# Patient Record
Sex: Male | Born: 1965 | Race: White | Hispanic: No | Marital: Married | Smoking: Never smoker
Health system: Southern US, Community
[De-identification: ages and names within clinical notes are randomized; demographics above are authoritative.]

## PROBLEM LIST (undated history)

## (undated) DIAGNOSIS — H409 Unspecified glaucoma: Secondary | ICD-10-CM

## (undated) HISTORY — PX: OTHER SURGICAL HISTORY: SHX169

## (undated) HISTORY — PX: MOUTH SURGERY: SHX715

---

## 2001-02-13 ENCOUNTER — Emergency Department (HOSPITAL_COMMUNITY): Admission: EM | Admit: 2001-02-13 | Discharge: 2001-02-14 | Payer: Self-pay | Admitting: Emergency Medicine

## 2001-02-14 ENCOUNTER — Encounter: Payer: Self-pay | Admitting: Emergency Medicine

## 2009-11-19 ENCOUNTER — Emergency Department (HOSPITAL_BASED_OUTPATIENT_CLINIC_OR_DEPARTMENT_OTHER): Admission: EM | Admit: 2009-11-19 | Discharge: 2009-11-19 | Payer: Self-pay | Admitting: Emergency Medicine

## 2009-11-19 ENCOUNTER — Ambulatory Visit: Payer: Self-pay | Admitting: Diagnostic Radiology

## 2010-06-13 LAB — POCT CARDIAC MARKERS
CKMB, poc: <1 ng/mL — ABNORMAL LOW (ref 1.0–8.0)
CKMB, poc: <1 ng/mL — ABNORMAL LOW (ref 1.0–8.0)
Myoglobin, poc: 29.9 ng/mL (ref 12–200)
Myoglobin, poc: 45.4 ng/mL (ref 12–200)
Troponin i, poc: <0.05 ng/mL (ref 0.00–0.09)
Troponin i, poc: <0.05 ng/mL (ref 0.00–0.09)

## 2010-06-13 LAB — BASIC METABOLIC PANEL
BUN: 7 mg/dL (ref 6–23)
CO2: 27 mEq/L (ref 19–32)
Calcium: 9.7 mg/dL (ref 8.4–10.5)
Chloride: 104 mEq/L (ref 96–112)
Creatinine, Ser: 0.9 mg/dL (ref 0.4–1.5)
GFR calc Af Amer: >60 mL/min (ref >60)
GFR calc non Af Amer: >60 mL/min (ref >60)
Glucose, Bld: 98 mg/dL (ref 70–99)
Potassium: 4.1 mEq/L (ref 3.5–5.1)
Sodium: 141 mEq/L (ref 135–145)

## 2010-06-13 LAB — DIFFERENTIAL
Basophils Absolute: 0.1 10*3/uL (ref 0.0–0.1)
Basophils Relative: 2 % — ABNORMAL HIGH (ref 0–1)
Eosinophils Absolute: 0.3 10*3/uL (ref 0.0–0.7)
Eosinophils Relative: 5 % (ref 0–5)
Lymphocytes Relative: 25 % (ref 12–46)
Lymphs Abs: 1.7 10*3/uL (ref 0.7–4.0)
Monocytes Absolute: 0.6 10*3/uL (ref 0.1–1.0)
Monocytes Relative: 9 % (ref 3–12)
Neutro Abs: 4.1 10*3/uL (ref 1.7–7.7)
Neutrophils Relative %: 60 % (ref 43–77)

## 2010-06-13 LAB — CBC
HCT: 43.3 % (ref 39.0–52.0)
Hemoglobin: 15.3 g/dL (ref 13.0–17.0)
MCH: 31.6 pg (ref 26.0–34.0)
MCHC: 35.4 g/dL (ref 30.0–36.0)
MCV: 89.3 fL (ref 78.0–100.0)
Platelets: 225 10*3/uL (ref 150–400)
RBC: 4.84 MIL/uL (ref 4.22–5.81)
RDW: 12.6 % (ref 11.5–15.5)
WBC: 6.8 10*3/uL (ref 4.0–10.5)

## 2010-09-23 DIAGNOSIS — H409 Unspecified glaucoma: Secondary | ICD-10-CM | POA: Insufficient documentation

## 2010-09-23 DIAGNOSIS — R0989 Other specified symptoms and signs involving the circulatory and respiratory systems: Secondary | ICD-10-CM

## 2010-09-23 DIAGNOSIS — Z72820 Sleep deprivation: Secondary | ICD-10-CM | POA: Insufficient documentation

## 2014-03-27 ENCOUNTER — Other Ambulatory Visit: Payer: Self-pay | Admitting: Family Medicine

## 2014-03-27 DIAGNOSIS — M545 Low back pain: Secondary | ICD-10-CM

## 2014-04-06 ENCOUNTER — Ambulatory Visit
Admission: RE | Admit: 2014-04-06 | Discharge: 2014-04-06 | Disposition: A | Discharge status: Home / Self Care | Payer: BLUE CROSS/BLUE SHIELD | Source: Ambulatory Visit | Attending: Family Medicine | Admitting: Family Medicine

## 2014-04-06 DIAGNOSIS — M545 Low back pain: Secondary | ICD-10-CM

## 2015-07-10 DIAGNOSIS — R1031 Right lower quadrant pain: Secondary | ICD-10-CM | POA: Diagnosis not present

## 2015-08-29 DIAGNOSIS — E7800 Pure hypercholesterolemia, unspecified: Secondary | ICD-10-CM | POA: Diagnosis not present

## 2015-11-26 DIAGNOSIS — E7800 Pure hypercholesterolemia, unspecified: Secondary | ICD-10-CM | POA: Diagnosis not present

## 2015-11-26 DIAGNOSIS — E669 Obesity, unspecified: Secondary | ICD-10-CM | POA: Diagnosis not present

## 2015-11-26 DIAGNOSIS — R7301 Impaired fasting glucose: Secondary | ICD-10-CM | POA: Diagnosis not present

## 2015-12-25 DIAGNOSIS — Z23 Encounter for immunization: Secondary | ICD-10-CM | POA: Diagnosis not present

## 2016-01-27 DIAGNOSIS — H401131 Primary open-angle glaucoma, bilateral, mild stage: Secondary | ICD-10-CM | POA: Diagnosis not present

## 2016-03-25 DIAGNOSIS — Z1211 Encounter for screening for malignant neoplasm of colon: Secondary | ICD-10-CM | POA: Diagnosis not present

## 2016-03-25 DIAGNOSIS — D126 Benign neoplasm of colon, unspecified: Secondary | ICD-10-CM | POA: Diagnosis not present

## 2016-03-25 DIAGNOSIS — K621 Rectal polyp: Secondary | ICD-10-CM | POA: Diagnosis not present

## 2016-03-31 DIAGNOSIS — Z1211 Encounter for screening for malignant neoplasm of colon: Secondary | ICD-10-CM | POA: Diagnosis not present

## 2016-03-31 DIAGNOSIS — D126 Benign neoplasm of colon, unspecified: Secondary | ICD-10-CM | POA: Diagnosis not present

## 2016-05-05 DIAGNOSIS — H401131 Primary open-angle glaucoma, bilateral, mild stage: Secondary | ICD-10-CM | POA: Diagnosis not present

## 2016-06-02 DIAGNOSIS — H401131 Primary open-angle glaucoma, bilateral, mild stage: Secondary | ICD-10-CM | POA: Diagnosis not present

## 2016-06-04 DIAGNOSIS — Z0000 Encounter for general adult medical examination without abnormal findings: Secondary | ICD-10-CM | POA: Diagnosis not present

## 2016-06-04 DIAGNOSIS — R740 Nonspecific elevation of levels of transaminase and lactic acid dehydrogenase [LDH]: Secondary | ICD-10-CM | POA: Diagnosis not present

## 2016-06-04 DIAGNOSIS — Z131 Encounter for screening for diabetes mellitus: Secondary | ICD-10-CM | POA: Diagnosis not present

## 2016-06-04 DIAGNOSIS — Z13220 Encounter for screening for lipoid disorders: Secondary | ICD-10-CM | POA: Diagnosis not present

## 2016-06-04 DIAGNOSIS — R7989 Other specified abnormal findings of blood chemistry: Secondary | ICD-10-CM | POA: Diagnosis not present

## 2016-06-04 DIAGNOSIS — Z125 Encounter for screening for malignant neoplasm of prostate: Secondary | ICD-10-CM | POA: Diagnosis not present

## 2016-06-24 DIAGNOSIS — R740 Nonspecific elevation of levels of transaminase and lactic acid dehydrogenase [LDH]: Secondary | ICD-10-CM | POA: Diagnosis not present

## 2016-06-30 IMAGING — MR MR LUMBAR SPINE W/O CM
5 series · 45 of 48 positions shown · non-contrast
Comparison: None.

CLINICAL DATA: Acute onset low back pain 5 weeks ago. Pain is
located centrally and radiates into the left leg to the foot.
Decreasing left leg numbness.

EXAM:
MRI LUMBAR SPINE WITHOUT CONTRAST
TECHNIQUE: Multiplanar, multisequence MR imaging of the lumbar spine was
performed. No intravenous contrast was administered.

[Series 3: T2 · sagittal · 4.0mm · 0.88mm/px · 6 of 14 slices shown (1 of 2)]
[im 1/14]
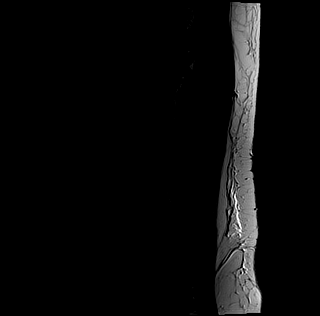
[im 3/14]
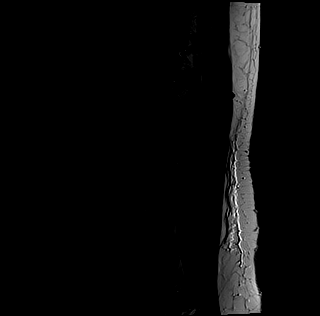
[im 6/14]
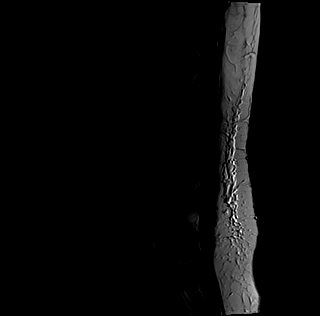
[im 8/14]
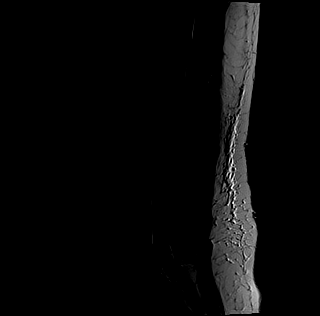
[im 11/14]
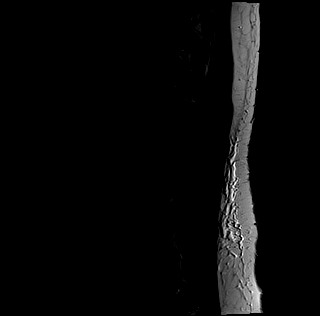
[im 14/14]
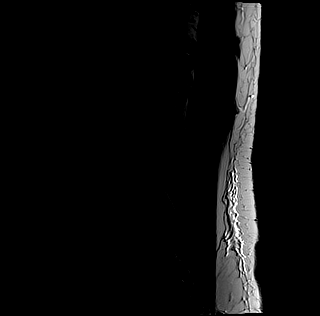

[Series 4: tirm sag · sagittal · 4.0mm · 0.55mm/px · 6 of 14 slices shown]
[im 1/14]
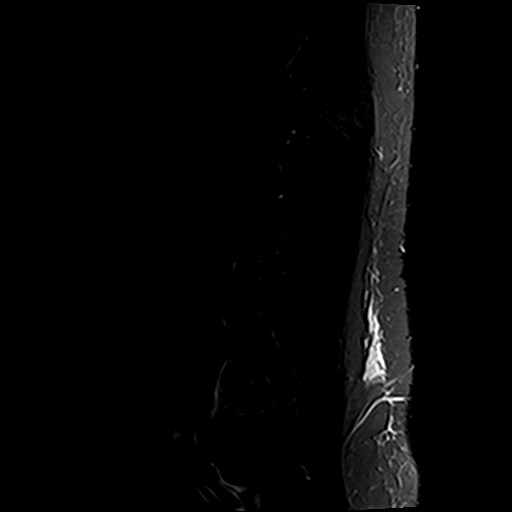
[im 3/14]
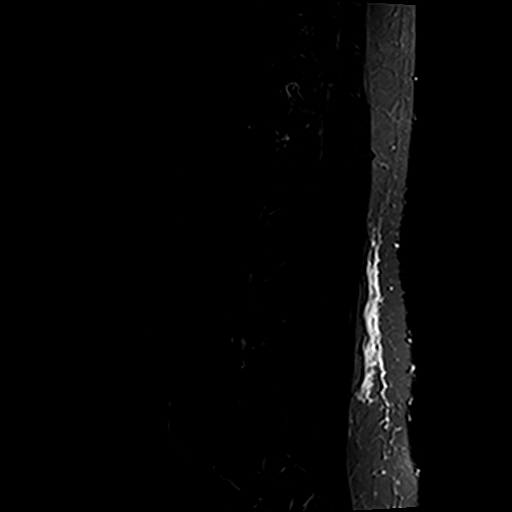
[im 6/14]
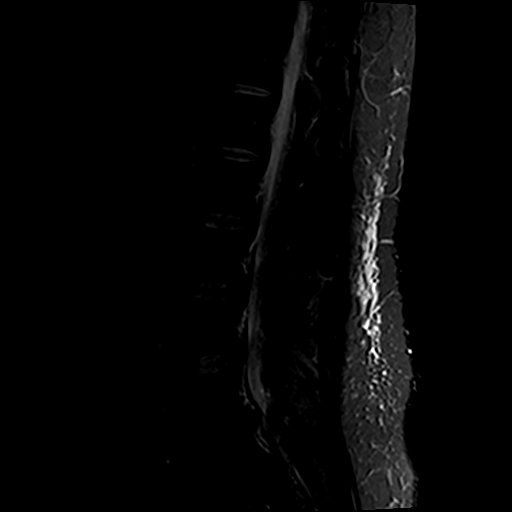
[im 8/14]
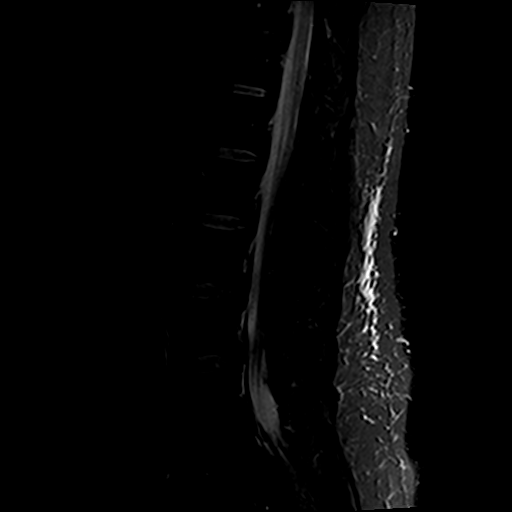
[im 11/14]
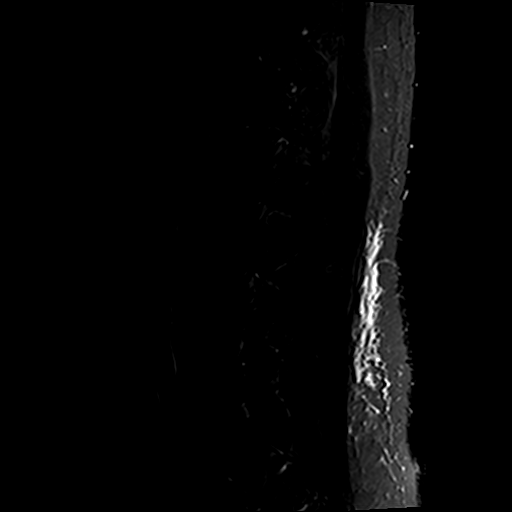
[im 14/14]
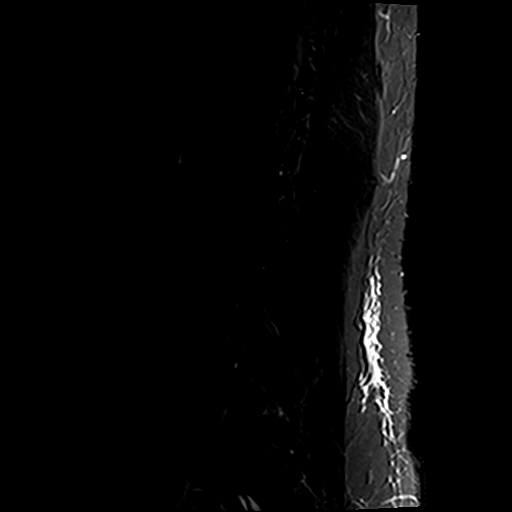

[Series 5: T1 · sagittal · 4.0mm · 0.88mm/px · 6 of 14 slices shown (1 of 2)]
[im 1/14]
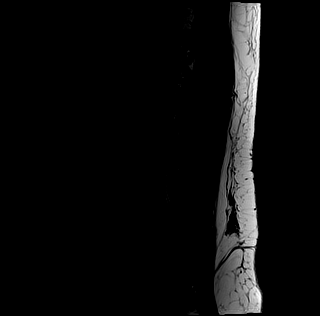
[im 3/14]
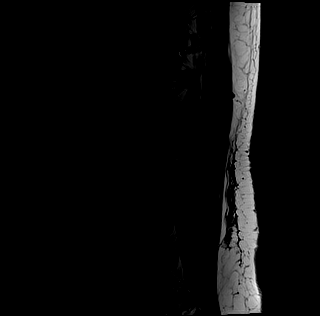
[im 6/14]
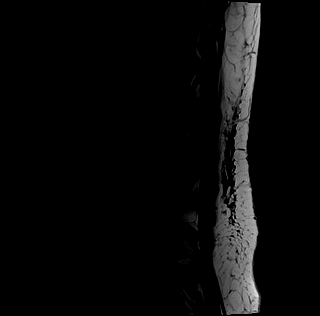
[im 8/14]
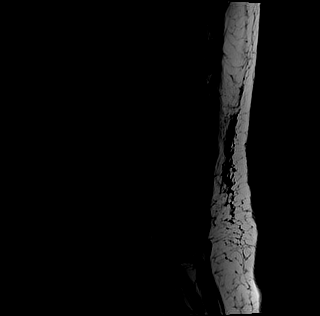
[im 11/14]
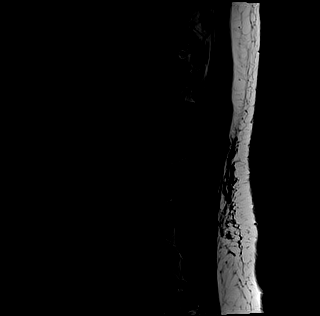
[im 14/14]
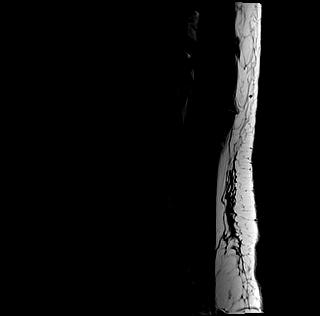

[Series 6: T1 · axial · 4.0mm · 0.70mm/px · z∈[-121,+72]mm · 12 of 35 slices shown (2 of 2)]
[im 1/35]
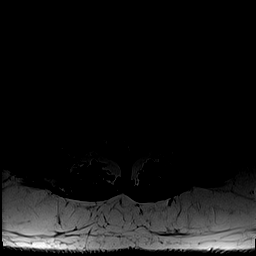
[im 3/35]
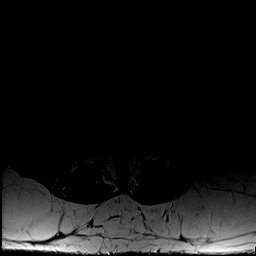
[im 5/35]
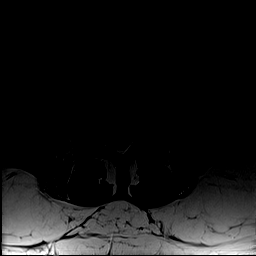
[im 8/35]
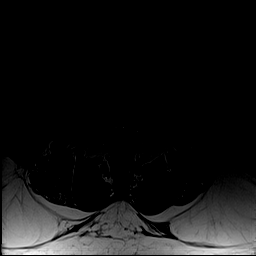
[im 10/35]
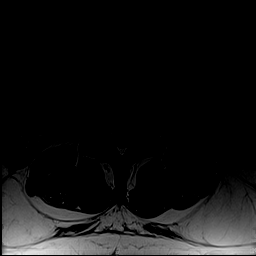
[im 13/35]
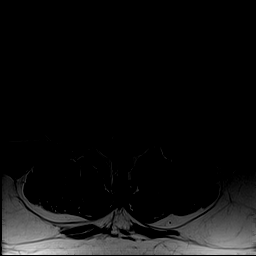
[im 15/35]
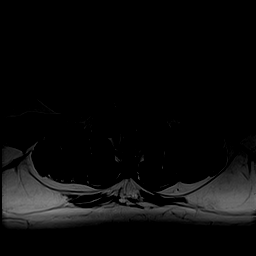
[im 18/35]
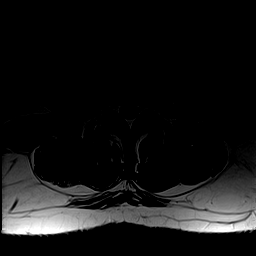
[im 20/35]
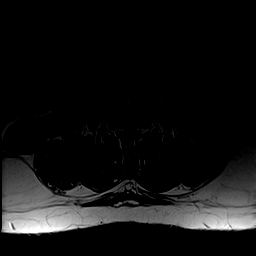
[im 25/35]
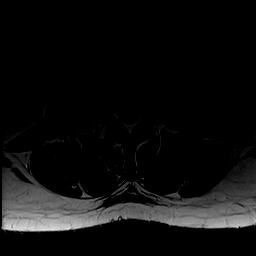
[im 30/35]
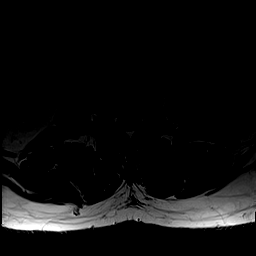
[im 35/35]
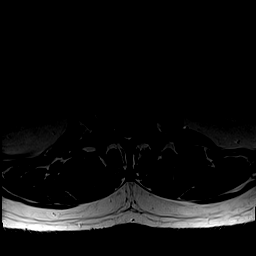

[Series 7: T2 · axial · 4.0mm · 0.70mm/px · z∈[-121,+72]mm · 15 of 35 slices shown (2 of 2)]
[im 1/35]
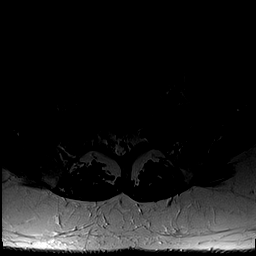
[im 3/35]
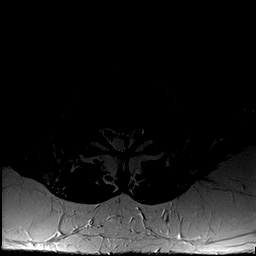
[im 5/35]
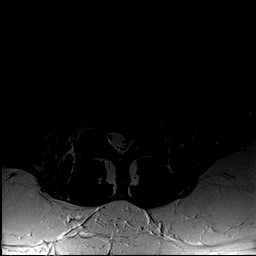
[im 8/35]
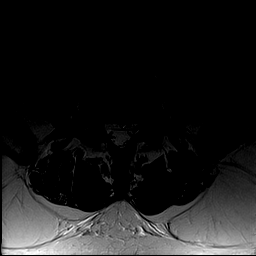
[im 10/35]
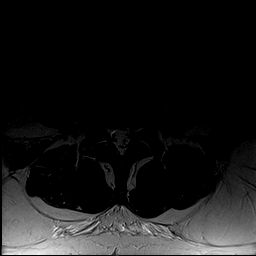
[im 13/35]
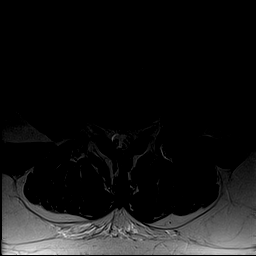
[im 15/35]
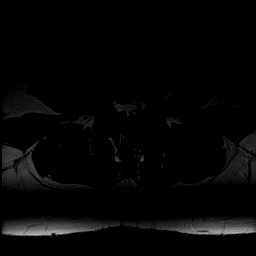
[im 18/35]
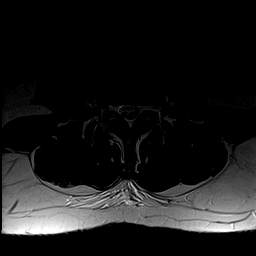
[im 20/35]
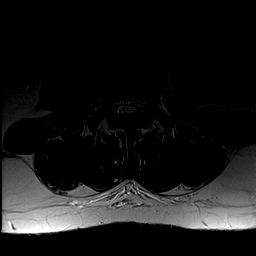
[im 22/35]
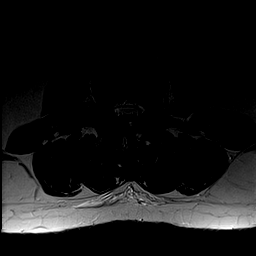
[im 25/35]
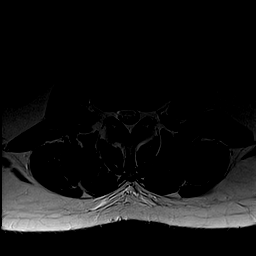
[im 27/35]
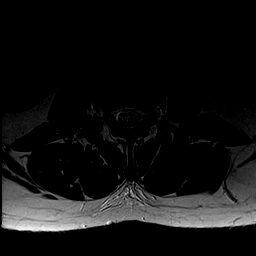
[im 30/35]
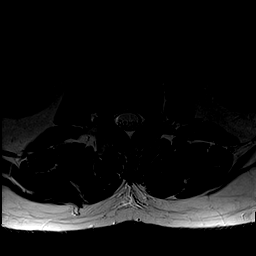
[im 32/35]
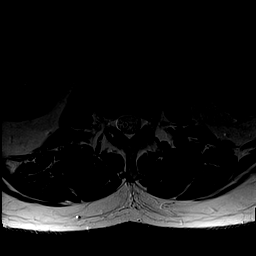
[im 35/35]
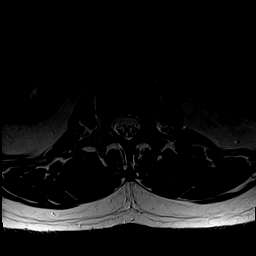

[45 of 48 positions shown; findings below may reference images not displayed]

FINDINGS: For the purposes of this dictation, the lowest well-formed
intervertebral disc space is presumed to be L5-S1.

Vertebral alignment is within normal limits. Vertebral body heights
are preserved. There is disc desiccation and mild disc space
narrowing at L5-S1. Intervertebral disc space heights are preserved
elsewhere. Minimal degenerative endplate changes are present at
L5-S1. No significant vertebral marrow edema is seen. There is mild
diffuse narrowing of the lumbar spinal canal on a congenital basis
due to short pedicles. Conus medullaris is normal in signal and
terminates at L1. Paraspinal soft tissues are unremarkable.

L1-2 through L3-4: No disc herniation or stenosis.

L4-5: Minimal disc bulging without stenosis.

L5-S1: Small left central disc protrusion narrows the left lateral
recess and posteriorly displaces the left S1 nerve root. No spinal
canal or neural foraminal stenosis.
IMPRESSION: L5-S1 disc protrusion likely affecting the left S1 nerve root in the
lateral recess.

## 2016-07-14 ENCOUNTER — Encounter: Payer: BLUE CROSS/BLUE SHIELD | Attending: Family Medicine | Admitting: Registered"

## 2016-07-14 ENCOUNTER — Encounter: Payer: Self-pay | Admitting: Registered"

## 2016-07-14 DIAGNOSIS — R7303 Prediabetes: Secondary | ICD-10-CM | POA: Diagnosis not present

## 2016-07-14 DIAGNOSIS — Z6834 Body mass index (BMI) 34.0-34.9, adult: Secondary | ICD-10-CM | POA: Diagnosis not present

## 2016-07-14 DIAGNOSIS — R739 Hyperglycemia, unspecified: Secondary | ICD-10-CM

## 2016-07-14 DIAGNOSIS — Z713 Dietary counseling and surveillance: Secondary | ICD-10-CM | POA: Insufficient documentation

## 2016-07-14 NOTE — Patient Instructions (Addendum)
Plan:  Aim for balanced meal with protein, carb, vegetables  Aim to include protein with carbs per snack if hungry Consider reading food labels for Total Carbohydrate and Sat Fat Grams of foods Continue your activity level as tolerated Continue taking medication as directed by MD Include nuts in your diet Consider adding fish 2-3 times a week Aim to have whole grains  If you feel like you are getting side tracked.... (make a plan for the days when you feel stressed or have not planned for eating healthy)

## 2016-07-14 NOTE — Progress Notes (Signed)
Medical Nutrition Therapy:  Appt start time: 1610 end time:  1725.   Assessment:  Primary concerns today: Pt states he kind of understands what to eat but wants to make sure he is on the right track. Pt reports after doctor came back with labs 5 weeks ago he changed diet, limited alcohol, and started exercising more. Pt states he weighed 256 lb prior to changes (15 lbs loss). 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  Pt states his cholesterol has been high for a long time because he loves to eat. Per chart A1C 6.2 - 06/05/14  Preferred Learning Style:   No preference indicated   Learning Readiness:   Change in progress  MEDICATIONS: reviewed   DIETARY INTAKE:  Usual eating pattern includes 3 meals and 2-3 snacks per day.  Everyday foods include coffee.  Avoided foods include "Not a big bread eater"  24-hr recall:  B ( AM): 2 eggs, decaf coffee creamer Snk ( AM): protein bars, apples, celery L ( PM): grilled chicken salad OR subway on flat wheat bread Snk ( PM): none D ( PM): grilled chicken salad OR none Snk ( PM): celery Beverages: water, diet coke, decaf coffee  Usual physical activity: stairs, walking 10,000-13,000 steps.  Estimated energy needs: 2000 calories 225 g carbohydrates 150 g protein 56 g fat  Progress Towards Goal(s):  In progress.   Nutritional Diagnosis:  NI-5.8.2 Excessive carbohydrate intake As related to history of high sugar beverages.  As evidenced by pt reported diet history before recent lifestyle changes..    Intervention:  Nutrition Education. Discussed food groups and the role in blood sugar. Described healthy eating patterns. Discussed role of exercise in healthy and blood glucose control. Discussed healthy fats.   Plan:  Aim for balanced meals with protein, carb, vegetables  Aim to  include protein with carbs per snack if hungry Consider reading food labels for Total Carbohydrate and Sat Fat Grams of foods Continue your activity level as tolerated Continue taking medication as directed by MD Include nuts in your diet Consider adding fish 2-3 times a week Aim to have whole grains  If you feel like you are getting side tracked.... (make a plan for the days when you feel stressed or have not planned for eating healthy)  Teaching Method Utilized:  Visual Auditory  Handouts given during visit include:  Copies from Living Well with Diabetes  Snack sheet  My Plate  Barriers to learning/adherence to lifestyle change: none  Demonstrated degree of understanding via:  Teach Back   Monitoring/Evaluation:  Dietary intake, exercise, and body weight prn.

## 2016-07-16 DIAGNOSIS — B078 Other viral warts: Secondary | ICD-10-CM | POA: Diagnosis not present

## 2016-07-16 DIAGNOSIS — L821 Other seborrheic keratosis: Secondary | ICD-10-CM | POA: Diagnosis not present

## 2016-07-16 DIAGNOSIS — D225 Melanocytic nevi of trunk: Secondary | ICD-10-CM | POA: Diagnosis not present

## 2016-07-16 DIAGNOSIS — L814 Other melanin hyperpigmentation: Secondary | ICD-10-CM | POA: Diagnosis not present

## 2016-08-04 DIAGNOSIS — R7303 Prediabetes: Secondary | ICD-10-CM | POA: Diagnosis not present

## 2016-08-04 DIAGNOSIS — E7800 Pure hypercholesterolemia, unspecified: Secondary | ICD-10-CM | POA: Diagnosis not present

## 2016-10-06 DIAGNOSIS — H401131 Primary open-angle glaucoma, bilateral, mild stage: Secondary | ICD-10-CM | POA: Diagnosis not present

## 2016-11-11 DIAGNOSIS — H401131 Primary open-angle glaucoma, bilateral, mild stage: Secondary | ICD-10-CM | POA: Diagnosis not present

## 2017-02-09 DIAGNOSIS — H401134 Primary open-angle glaucoma, bilateral, indeterminate stage: Secondary | ICD-10-CM | POA: Diagnosis not present

## 2017-10-07 DIAGNOSIS — H6061 Unspecified chronic otitis externa, right ear: Secondary | ICD-10-CM | POA: Diagnosis not present

## 2017-10-07 DIAGNOSIS — H6121 Impacted cerumen, right ear: Secondary | ICD-10-CM | POA: Diagnosis not present

## 2017-12-21 DIAGNOSIS — Z23 Encounter for immunization: Secondary | ICD-10-CM | POA: Diagnosis not present

## 2018-08-01 DIAGNOSIS — H401131 Primary open-angle glaucoma, bilateral, mild stage: Secondary | ICD-10-CM | POA: Diagnosis not present

## 2018-11-15 DIAGNOSIS — R7303 Prediabetes: Secondary | ICD-10-CM | POA: Diagnosis not present

## 2018-11-15 DIAGNOSIS — N529 Male erectile dysfunction, unspecified: Secondary | ICD-10-CM | POA: Diagnosis not present

## 2018-11-15 DIAGNOSIS — R030 Elevated blood-pressure reading, without diagnosis of hypertension: Secondary | ICD-10-CM | POA: Diagnosis not present

## 2018-11-29 DIAGNOSIS — H401131 Primary open-angle glaucoma, bilateral, mild stage: Secondary | ICD-10-CM | POA: Diagnosis not present

## 2018-12-15 DIAGNOSIS — M9903 Segmental and somatic dysfunction of lumbar region: Secondary | ICD-10-CM | POA: Diagnosis not present

## 2018-12-15 DIAGNOSIS — M5431 Sciatica, right side: Secondary | ICD-10-CM | POA: Diagnosis not present

## 2018-12-19 DIAGNOSIS — M5431 Sciatica, right side: Secondary | ICD-10-CM | POA: Diagnosis not present

## 2018-12-19 DIAGNOSIS — M9903 Segmental and somatic dysfunction of lumbar region: Secondary | ICD-10-CM | POA: Diagnosis not present

## 2018-12-20 DIAGNOSIS — M5431 Sciatica, right side: Secondary | ICD-10-CM | POA: Diagnosis not present

## 2018-12-20 DIAGNOSIS — M9903 Segmental and somatic dysfunction of lumbar region: Secondary | ICD-10-CM | POA: Diagnosis not present

## 2018-12-22 DIAGNOSIS — M5431 Sciatica, right side: Secondary | ICD-10-CM | POA: Diagnosis not present

## 2018-12-22 DIAGNOSIS — M9903 Segmental and somatic dysfunction of lumbar region: Secondary | ICD-10-CM | POA: Diagnosis not present

## 2019-01-02 DIAGNOSIS — M5431 Sciatica, right side: Secondary | ICD-10-CM | POA: Diagnosis not present

## 2019-01-02 DIAGNOSIS — H401130 Primary open-angle glaucoma, bilateral, stage unspecified: Secondary | ICD-10-CM | POA: Diagnosis not present

## 2019-01-02 DIAGNOSIS — M9903 Segmental and somatic dysfunction of lumbar region: Secondary | ICD-10-CM | POA: Diagnosis not present

## 2019-01-04 DIAGNOSIS — M9903 Segmental and somatic dysfunction of lumbar region: Secondary | ICD-10-CM | POA: Diagnosis not present

## 2019-01-04 DIAGNOSIS — M5431 Sciatica, right side: Secondary | ICD-10-CM | POA: Diagnosis not present

## 2019-01-09 DIAGNOSIS — M5431 Sciatica, right side: Secondary | ICD-10-CM | POA: Diagnosis not present

## 2019-01-09 DIAGNOSIS — M9903 Segmental and somatic dysfunction of lumbar region: Secondary | ICD-10-CM | POA: Diagnosis not present

## 2019-01-11 DIAGNOSIS — M5431 Sciatica, right side: Secondary | ICD-10-CM | POA: Diagnosis not present

## 2019-01-11 DIAGNOSIS — M9903 Segmental and somatic dysfunction of lumbar region: Secondary | ICD-10-CM | POA: Diagnosis not present

## 2019-01-16 DIAGNOSIS — M9903 Segmental and somatic dysfunction of lumbar region: Secondary | ICD-10-CM | POA: Diagnosis not present

## 2019-01-16 DIAGNOSIS — M5431 Sciatica, right side: Secondary | ICD-10-CM | POA: Diagnosis not present

## 2019-01-19 DIAGNOSIS — M9903 Segmental and somatic dysfunction of lumbar region: Secondary | ICD-10-CM | POA: Diagnosis not present

## 2019-01-19 DIAGNOSIS — M5431 Sciatica, right side: Secondary | ICD-10-CM | POA: Diagnosis not present

## 2019-01-23 DIAGNOSIS — M5431 Sciatica, right side: Secondary | ICD-10-CM | POA: Diagnosis not present

## 2019-01-23 DIAGNOSIS — M9903 Segmental and somatic dysfunction of lumbar region: Secondary | ICD-10-CM | POA: Diagnosis not present

## 2019-01-25 DIAGNOSIS — M5431 Sciatica, right side: Secondary | ICD-10-CM | POA: Diagnosis not present

## 2019-01-25 DIAGNOSIS — N529 Male erectile dysfunction, unspecified: Secondary | ICD-10-CM | POA: Diagnosis not present

## 2019-01-30 DIAGNOSIS — M5431 Sciatica, right side: Secondary | ICD-10-CM | POA: Diagnosis not present

## 2019-01-30 DIAGNOSIS — M9903 Segmental and somatic dysfunction of lumbar region: Secondary | ICD-10-CM | POA: Diagnosis not present

## 2019-02-01 DIAGNOSIS — H401130 Primary open-angle glaucoma, bilateral, stage unspecified: Secondary | ICD-10-CM | POA: Diagnosis not present

## 2019-02-06 DIAGNOSIS — M5431 Sciatica, right side: Secondary | ICD-10-CM | POA: Diagnosis not present

## 2019-02-06 DIAGNOSIS — M9903 Segmental and somatic dysfunction of lumbar region: Secondary | ICD-10-CM | POA: Diagnosis not present

## 2019-02-07 DIAGNOSIS — R030 Elevated blood-pressure reading, without diagnosis of hypertension: Secondary | ICD-10-CM | POA: Diagnosis not present

## 2019-02-07 DIAGNOSIS — M5416 Radiculopathy, lumbar region: Secondary | ICD-10-CM | POA: Diagnosis not present

## 2019-02-07 DIAGNOSIS — Z6825 Body mass index (BMI) 25.0-25.9, adult: Secondary | ICD-10-CM | POA: Diagnosis not present

## 2019-02-13 DIAGNOSIS — M9903 Segmental and somatic dysfunction of lumbar region: Secondary | ICD-10-CM | POA: Diagnosis not present

## 2019-02-13 DIAGNOSIS — M5431 Sciatica, right side: Secondary | ICD-10-CM | POA: Diagnosis not present

## 2019-02-16 DIAGNOSIS — M9903 Segmental and somatic dysfunction of lumbar region: Secondary | ICD-10-CM | POA: Diagnosis not present

## 2019-02-16 DIAGNOSIS — M5431 Sciatica, right side: Secondary | ICD-10-CM | POA: Diagnosis not present

## 2019-02-27 DIAGNOSIS — M5431 Sciatica, right side: Secondary | ICD-10-CM | POA: Diagnosis not present

## 2019-02-27 DIAGNOSIS — M9903 Segmental and somatic dysfunction of lumbar region: Secondary | ICD-10-CM | POA: Diagnosis not present

## 2019-02-28 ENCOUNTER — Ambulatory Visit
Admission: EM | Admit: 2019-02-28 | Discharge: 2019-02-28 | Disposition: A | Discharge status: Home / Self Care | Payer: BLUE CROSS/BLUE SHIELD | Attending: Emergency Medicine | Admitting: Emergency Medicine

## 2019-02-28 DIAGNOSIS — Z20828 Contact with and (suspected) exposure to other viral communicable diseases: Secondary | ICD-10-CM

## 2019-02-28 DIAGNOSIS — Z20822 Contact with and (suspected) exposure to covid-19: Secondary | ICD-10-CM

## 2019-02-28 DIAGNOSIS — R030 Elevated blood-pressure reading, without diagnosis of hypertension: Secondary | ICD-10-CM

## 2019-02-28 HISTORY — DX: Unspecified glaucoma: H40.9

## 2019-02-28 NOTE — Discharge Instructions (Signed)
Your COVID test is pending - it is important to quarantine / isolate at home until your results are back. °If you test positive and would like further evaluation for persistent or worsening symptoms, you may schedule an E-visit or virtual (video) visit throughout the [facility] MyChart app or website. ° °PLEASE NOTE: If you develop severe chest pain or shortness of breath please go to the ER or call 9-1-1 for further evaluation --> DO NOT schedule electronic or virtual visits for this. °Please call our office for further guidance / recommendations as needed. °

## 2019-02-28 NOTE — ED Provider Notes (Signed)
[facility]    CSN: [id number] Arrival date & time: 02/28/19  1052      History   Chief Complaint Chief Complaint  Patient presents with  . covid test    HPI Jerry Hood is a 53 y.o. male presenting for Covid testing.  States he returned from international trip to [place] few days ago.  Denies known Covid contacts, symptoms, fever.   Past Medical History:  Diagnosis Date  . Glaucoma     Patient Active Problem List   Diagnosis Date Noted  . Sinus complaint 09/23/2010  . Glaucoma 09/23/2010  . Problems related to lack of adequate sleep 09/23/2010    Past Surgical History:  Procedure Laterality Date  . MOUTH SURGERY    . right shoulder surgery         Home Medications    Prior to Admission medications   Medication Sig Start Date End Date Taking? Authorizing Provider  aspirin 81 MG chewable tablet Chew by mouth daily.   Yes [provider]  Multiple Vitamins-Minerals (MULTIVITAMIN WITH MINERALS) tablet Take 1 tablet by mouth daily.   Yes [provider]  Omega-3 Fatty Acids (FISH OIL) 1000 MG CAPS Take by mouth.   Yes [provider]  atorvastatin (LIPITOR) 20 MG tablet Take 20 mg by mouth daily.    [provider]  bimatoprost (LUMIGAN) 0.01 % SOLN 1 drop at bedtime.    [provider]  Cetirizine HCl (ZYRTEC ALLERGY) 10 MG CAPS Take by mouth.    [provider]  DiphenhydrAMINE HCl (BENADRYL ALLERGY PO) Take by mouth.    [provider]  Dorzolamide HCl-Timolol Mal (COSOPT OP) Apply to eye.      [provider]  IBUPROFEN IB PO Take by mouth.      [provider]  MELATONIN ER PO Take by mouth.    [provider]  Zolpidem Tartrate (AMBIEN PO) Take by mouth.      [provider]    Family History Family History  Problem Relation Age of Onset  . Arthritis Other   . Hypertension Other   . COPD Mother   . Heart failure Father     Social History  Social History   Tobacco Use  . Smoking status: Never Smoker  . Smokeless tobacco: Never Used  Substance Use Topics  . Alcohol use: Yes  . Drug use: No     Allergies   Cholestatin   Review of Systems Review of Systems  Constitutional: Negative for fatigue and fever.  HENT: Negative for congestion, dental problem, ear pain, facial swelling, hearing loss, sinus pain, sore throat, trouble swallowing and voice change.   Eyes: Negative for photophobia, pain and visual disturbance.  Respiratory: Negative for cough and shortness of breath.   Cardiovascular: Negative for chest pain and palpitations.  Gastrointestinal: Negative for abdominal pain, diarrhea and vomiting.  Musculoskeletal: Negative for arthralgias and myalgias.  Skin: Negative for rash and wound.  Neurological: Negative for dizziness, speech difficulty and headaches.  All other systems reviewed and are negative.    Physical Exam Triage Vital Signs ED Triage Vitals  Enc Vitals Group     BP      Pulse      Resp      Temp      Temp src      SpO2      Weight      Height      Head Circumference  Peak Flow      Pain Score      Pain Loc      Pain Edu?      Excl. in [place]?    No data found.  Updated Vital Signs BP (!) 176/112   Pulse 68   Temp 98 F (36.7 C)   Resp 18   SpO2 100%   Visual Acuity Right Eye Distance:   Left Eye Distance:   Bilateral Distance:    Right Eye Near:   Left Eye Near:    Bilateral Near:     Physical Exam Constitutional:      General: He is not in acute distress. HENT:     Head: Normocephalic and atraumatic.  Eyes:     General: No scleral icterus.    Pupils: Pupils are equal, round, and reactive to light.  Cardiovascular:     Rate and Rhythm: Normal rate and regular rhythm.  Pulmonary:     Effort: Pulmonary effort is normal. No respiratory distress.     Breath sounds: No wheezing.  Skin:    Coloration: Skin is not jaundiced or pale.  Neurological:     Mental  Status: He is alert and oriented to person, place, and time.      UC Treatments / Results  Labs (all labs ordered are listed, but only abnormal results are displayed) Labs Reviewed  NOVEL CORONAVIRUS, NAA    EKG   Radiology No results found.  Procedures Procedures (including critical care time)  Medications Ordered in UC Medications - No data to display  Initial Impression / Assessment and Plan / UC Course  I have reviewed the triage vital signs and the nursing notes.  Pertinent labs & imaging results that were available during my care of the patient were reviewed by me and considered in my medical decision making (see chart for details).     Covid PCR pending: Patient to quarantine until results are back.  Discussed that patient should monitor for fever, symptoms for 14 days since returning to the [place].  Patient is hypertensive, though asymptomatic in office.  Repeat blood pressure done by me during visit: 169/104.  States he will check BP at home daily and follow-up with PCP for further management.  Return precautions discussed, patient verbalized understanding and is agreeable to plan. Final Clinical Impressions(s) / UC Diagnoses   Final diagnoses:  Encounter for laboratory testing for COVID-19 virus     Discharge Instructions     Your COVID test is pending - it is important to quarantine / isolate at home until your results are back. If you test positive and would like further evaluation for persistent or worsening symptoms, you may schedule an E-visit or virtual (video) visit throughout the [facility] app or website.  PLEASE NOTE: If you develop severe chest pain or shortness of breath please go to the ER or call 9-1-1 for further evaluation --> DO NOT schedule electronic or virtual visits for this. Please call our office for further guidance / recommendations as needed.    ED Prescriptions    None     PDMP not reviewed this encounter.   Hall[place]Potvin,  [place], [place] 02/28/19 1132

## 2019-02-28 NOTE — ED Triage Notes (Signed)
Pt presents with complaints of needing a covid test before going back to work. The patient just returned from [place]. Denies any symptoms.

## 2019-03-02 LAB — NOVEL CORONAVIRUS, NAA: SARS-CoV-2, NAA: NOT DETECTED

## 2019-03-13 DIAGNOSIS — M9903 Segmental and somatic dysfunction of lumbar region: Secondary | ICD-10-CM | POA: Diagnosis not present

## 2019-03-13 DIAGNOSIS — M5431 Sciatica, right side: Secondary | ICD-10-CM | POA: Diagnosis not present

## 2019-03-20 DIAGNOSIS — I10 Essential (primary) hypertension: Secondary | ICD-10-CM | POA: Diagnosis not present

## 2019-03-20 DIAGNOSIS — Z0000 Encounter for general adult medical examination without abnormal findings: Secondary | ICD-10-CM | POA: Diagnosis not present

## 2019-03-29 DIAGNOSIS — M5431 Sciatica, right side: Secondary | ICD-10-CM | POA: Diagnosis not present

## 2019-03-29 DIAGNOSIS — M9903 Segmental and somatic dysfunction of lumbar region: Secondary | ICD-10-CM | POA: Diagnosis not present

## 2019-04-03 DIAGNOSIS — H401112 Primary open-angle glaucoma, right eye, moderate stage: Secondary | ICD-10-CM | POA: Diagnosis not present

## 2019-04-03 DIAGNOSIS — H401123 Primary open-angle glaucoma, left eye, severe stage: Secondary | ICD-10-CM | POA: Diagnosis not present

## 2019-04-05 DIAGNOSIS — Z23 Encounter for immunization: Secondary | ICD-10-CM | POA: Diagnosis not present

## 2019-04-05 DIAGNOSIS — Z6832 Body mass index (BMI) 32.0-32.9, adult: Secondary | ICD-10-CM | POA: Diagnosis not present

## 2019-04-05 DIAGNOSIS — Z125 Encounter for screening for malignant neoplasm of prostate: Secondary | ICD-10-CM | POA: Diagnosis not present

## 2019-04-05 DIAGNOSIS — Z0000 Encounter for general adult medical examination without abnormal findings: Secondary | ICD-10-CM | POA: Diagnosis not present

## 2019-05-16 DIAGNOSIS — H401112 Primary open-angle glaucoma, right eye, moderate stage: Secondary | ICD-10-CM | POA: Diagnosis not present

## 2019-05-16 DIAGNOSIS — H401123 Primary open-angle glaucoma, left eye, severe stage: Secondary | ICD-10-CM | POA: Diagnosis not present

## 2019-06-08 DIAGNOSIS — H401123 Primary open-angle glaucoma, left eye, severe stage: Secondary | ICD-10-CM | POA: Diagnosis not present

## 2019-07-11 DIAGNOSIS — E7800 Pure hypercholesterolemia, unspecified: Secondary | ICD-10-CM | POA: Diagnosis not present

## 2019-08-29 DIAGNOSIS — M5431 Sciatica, right side: Secondary | ICD-10-CM | POA: Diagnosis not present

## 2019-08-29 DIAGNOSIS — M9903 Segmental and somatic dysfunction of lumbar region: Secondary | ICD-10-CM | POA: Diagnosis not present

## 2019-09-04 DIAGNOSIS — E7800 Pure hypercholesterolemia, unspecified: Secondary | ICD-10-CM | POA: Diagnosis not present

## 2019-09-19 DIAGNOSIS — M9903 Segmental and somatic dysfunction of lumbar region: Secondary | ICD-10-CM | POA: Diagnosis not present

## 2019-09-19 DIAGNOSIS — M5431 Sciatica, right side: Secondary | ICD-10-CM | POA: Diagnosis not present

## 2019-09-22 DIAGNOSIS — M779 Enthesopathy, unspecified: Secondary | ICD-10-CM | POA: Diagnosis not present

## 2019-09-22 DIAGNOSIS — I10 Essential (primary) hypertension: Secondary | ICD-10-CM | POA: Diagnosis not present

## 2019-09-22 DIAGNOSIS — E7800 Pure hypercholesterolemia, unspecified: Secondary | ICD-10-CM | POA: Diagnosis not present

## 2019-09-27 DIAGNOSIS — H401123 Primary open-angle glaucoma, left eye, severe stage: Secondary | ICD-10-CM | POA: Diagnosis not present

## 2019-09-27 DIAGNOSIS — H401112 Primary open-angle glaucoma, right eye, moderate stage: Secondary | ICD-10-CM | POA: Diagnosis not present

## 2019-09-27 DIAGNOSIS — Z9889 Other specified postprocedural states: Secondary | ICD-10-CM | POA: Diagnosis not present

## 2019-10-25 DIAGNOSIS — M9903 Segmental and somatic dysfunction of lumbar region: Secondary | ICD-10-CM | POA: Diagnosis not present

## 2019-10-25 DIAGNOSIS — M5431 Sciatica, right side: Secondary | ICD-10-CM | POA: Diagnosis not present

## 2019-11-15 DIAGNOSIS — H401112 Primary open-angle glaucoma, right eye, moderate stage: Secondary | ICD-10-CM | POA: Diagnosis not present

## 2019-11-15 DIAGNOSIS — M5431 Sciatica, right side: Secondary | ICD-10-CM | POA: Diagnosis not present

## 2019-11-15 DIAGNOSIS — Z9889 Other specified postprocedural states: Secondary | ICD-10-CM | POA: Diagnosis not present

## 2019-11-15 DIAGNOSIS — H401123 Primary open-angle glaucoma, left eye, severe stage: Secondary | ICD-10-CM | POA: Diagnosis not present

## 2019-11-15 DIAGNOSIS — M9903 Segmental and somatic dysfunction of lumbar region: Secondary | ICD-10-CM | POA: Diagnosis not present

## 2019-12-06 DIAGNOSIS — M5431 Sciatica, right side: Secondary | ICD-10-CM | POA: Diagnosis not present

## 2019-12-06 DIAGNOSIS — M9903 Segmental and somatic dysfunction of lumbar region: Secondary | ICD-10-CM | POA: Diagnosis not present

## 2020-01-09 DIAGNOSIS — M9903 Segmental and somatic dysfunction of lumbar region: Secondary | ICD-10-CM | POA: Diagnosis not present

## 2020-01-09 DIAGNOSIS — M5431 Sciatica, right side: Secondary | ICD-10-CM | POA: Diagnosis not present

## 2020-01-10 DIAGNOSIS — G5622 Lesion of ulnar nerve, left upper limb: Secondary | ICD-10-CM | POA: Diagnosis not present

## 2020-01-10 DIAGNOSIS — M25532 Pain in left wrist: Secondary | ICD-10-CM | POA: Diagnosis not present

## 2020-01-10 DIAGNOSIS — M25522 Pain in left elbow: Secondary | ICD-10-CM | POA: Diagnosis not present

## 2020-02-06 DIAGNOSIS — M5431 Sciatica, right side: Secondary | ICD-10-CM | POA: Diagnosis not present

## 2020-02-06 DIAGNOSIS — M9903 Segmental and somatic dysfunction of lumbar region: Secondary | ICD-10-CM | POA: Diagnosis not present

## 2020-02-13 DIAGNOSIS — Z9889 Other specified postprocedural states: Secondary | ICD-10-CM | POA: Diagnosis not present

## 2020-02-13 DIAGNOSIS — H401112 Primary open-angle glaucoma, right eye, moderate stage: Secondary | ICD-10-CM | POA: Diagnosis not present

## 2020-02-13 DIAGNOSIS — H401123 Primary open-angle glaucoma, left eye, severe stage: Secondary | ICD-10-CM | POA: Diagnosis not present

## 2020-03-13 DIAGNOSIS — M9903 Segmental and somatic dysfunction of lumbar region: Secondary | ICD-10-CM | POA: Diagnosis not present

## 2020-03-13 DIAGNOSIS — M5431 Sciatica, right side: Secondary | ICD-10-CM | POA: Diagnosis not present

## 2020-04-24 DIAGNOSIS — M9903 Segmental and somatic dysfunction of lumbar region: Secondary | ICD-10-CM | POA: Diagnosis not present

## 2020-04-24 DIAGNOSIS — M5431 Sciatica, right side: Secondary | ICD-10-CM | POA: Diagnosis not present

## 2020-05-14 DIAGNOSIS — Z9889 Other specified postprocedural states: Secondary | ICD-10-CM | POA: Diagnosis not present

## 2020-05-14 DIAGNOSIS — H401112 Primary open-angle glaucoma, right eye, moderate stage: Secondary | ICD-10-CM | POA: Diagnosis not present

## 2020-05-14 DIAGNOSIS — H401123 Primary open-angle glaucoma, left eye, severe stage: Secondary | ICD-10-CM | POA: Diagnosis not present

## 2020-09-16 DIAGNOSIS — H401112 Primary open-angle glaucoma, right eye, moderate stage: Secondary | ICD-10-CM | POA: Diagnosis not present

## 2020-09-16 DIAGNOSIS — Z9889 Other specified postprocedural states: Secondary | ICD-10-CM | POA: Diagnosis not present

## 2020-09-16 DIAGNOSIS — H401123 Primary open-angle glaucoma, left eye, severe stage: Secondary | ICD-10-CM | POA: Diagnosis not present

## 2020-10-15 DIAGNOSIS — G5621 Lesion of ulnar nerve, right upper limb: Secondary | ICD-10-CM | POA: Diagnosis not present

## 2020-10-15 DIAGNOSIS — R251 Tremor, unspecified: Secondary | ICD-10-CM | POA: Diagnosis not present

## 2020-10-15 DIAGNOSIS — I10 Essential (primary) hypertension: Secondary | ICD-10-CM | POA: Diagnosis not present

## 2020-11-05 DIAGNOSIS — Z125 Encounter for screening for malignant neoplasm of prostate: Secondary | ICD-10-CM | POA: Diagnosis not present

## 2020-11-05 DIAGNOSIS — G5621 Lesion of ulnar nerve, right upper limb: Secondary | ICD-10-CM | POA: Diagnosis not present

## 2020-11-05 DIAGNOSIS — I10 Essential (primary) hypertension: Secondary | ICD-10-CM | POA: Diagnosis not present

## 2020-11-05 DIAGNOSIS — Z13220 Encounter for screening for lipoid disorders: Secondary | ICD-10-CM | POA: Diagnosis not present

## 2021-01-13 DIAGNOSIS — H401123 Primary open-angle glaucoma, left eye, severe stage: Secondary | ICD-10-CM | POA: Diagnosis not present

## 2021-01-13 DIAGNOSIS — Z9889 Other specified postprocedural states: Secondary | ICD-10-CM | POA: Diagnosis not present

## 2021-01-13 DIAGNOSIS — H401112 Primary open-angle glaucoma, right eye, moderate stage: Secondary | ICD-10-CM | POA: Diagnosis not present

## 2021-05-26 DIAGNOSIS — H401112 Primary open-angle glaucoma, right eye, moderate stage: Secondary | ICD-10-CM | POA: Diagnosis not present

## 2021-05-26 DIAGNOSIS — H401123 Primary open-angle glaucoma, left eye, severe stage: Secondary | ICD-10-CM | POA: Diagnosis not present

## 2021-05-29 DIAGNOSIS — Z0000 Encounter for general adult medical examination without abnormal findings: Secondary | ICD-10-CM | POA: Diagnosis not present

## 2021-05-29 DIAGNOSIS — Z125 Encounter for screening for malignant neoplasm of prostate: Secondary | ICD-10-CM | POA: Diagnosis not present

## 2021-05-29 DIAGNOSIS — I10 Essential (primary) hypertension: Secondary | ICD-10-CM | POA: Diagnosis not present

## 2021-05-29 DIAGNOSIS — R7309 Other abnormal glucose: Secondary | ICD-10-CM | POA: Diagnosis not present

## 2021-05-29 DIAGNOSIS — E782 Mixed hyperlipidemia: Secondary | ICD-10-CM | POA: Diagnosis not present

## 2021-05-29 DIAGNOSIS — R251 Tremor, unspecified: Secondary | ICD-10-CM | POA: Diagnosis not present

## 2021-05-29 DIAGNOSIS — M2550 Pain in unspecified joint: Secondary | ICD-10-CM | POA: Diagnosis not present

## 2021-06-03 DIAGNOSIS — H401112 Primary open-angle glaucoma, right eye, moderate stage: Secondary | ICD-10-CM | POA: Diagnosis not present

## 2021-06-03 DIAGNOSIS — H401123 Primary open-angle glaucoma, left eye, severe stage: Secondary | ICD-10-CM | POA: Diagnosis not present

## 2021-08-18 DIAGNOSIS — K648 Other hemorrhoids: Secondary | ICD-10-CM | POA: Diagnosis not present

## 2021-08-18 DIAGNOSIS — Z86010 Personal history of colonic polyps: Secondary | ICD-10-CM | POA: Diagnosis not present

## 2021-09-25 DIAGNOSIS — H6123 Impacted cerumen, bilateral: Secondary | ICD-10-CM | POA: Diagnosis not present

## 2021-10-01 DIAGNOSIS — H401112 Primary open-angle glaucoma, right eye, moderate stage: Secondary | ICD-10-CM | POA: Diagnosis not present

## 2021-10-01 DIAGNOSIS — H401123 Primary open-angle glaucoma, left eye, severe stage: Secondary | ICD-10-CM | POA: Diagnosis not present

## 2021-10-13 DIAGNOSIS — Z1389 Encounter for screening for other disorder: Secondary | ICD-10-CM | POA: Diagnosis not present

## 2021-10-13 DIAGNOSIS — I10 Essential (primary) hypertension: Secondary | ICD-10-CM | POA: Diagnosis not present

## 2021-10-13 DIAGNOSIS — R0602 Shortness of breath: Secondary | ICD-10-CM | POA: Diagnosis not present

## 2021-10-13 DIAGNOSIS — R5383 Other fatigue: Secondary | ICD-10-CM | POA: Diagnosis not present

## 2021-10-13 DIAGNOSIS — R7303 Prediabetes: Secondary | ICD-10-CM | POA: Diagnosis not present

## 2021-10-13 DIAGNOSIS — E7800 Pure hypercholesterolemia, unspecified: Secondary | ICD-10-CM | POA: Diagnosis not present

## 2021-10-28 ENCOUNTER — Other Ambulatory Visit (HOSPITAL_COMMUNITY): Payer: Self-pay

## 2021-10-28 MED ORDER — SAXENDA 18 MG/3ML ~~LOC~~ SOPN
PEN_INJECTOR | SUBCUTANEOUS | 1 refills | Status: AC
  Filled 2021-10-28: qty 15, 30d supply, fill #0

## 2021-10-28 MED ORDER — INSULIN PEN NEEDLE 32G X 4 MM MISC
0 refills | Status: DC
  Filled 2021-10-28: qty 100, 30d supply, fill #0
  Filled 2021-10-29: qty 100, 90d supply, fill #0

## 2021-10-29 ENCOUNTER — Other Ambulatory Visit (HOSPITAL_COMMUNITY): Payer: Self-pay

## 2021-11-10 DIAGNOSIS — I10 Essential (primary) hypertension: Secondary | ICD-10-CM | POA: Diagnosis not present

## 2021-11-10 DIAGNOSIS — R7303 Prediabetes: Secondary | ICD-10-CM | POA: Diagnosis not present

## 2021-11-10 DIAGNOSIS — E7800 Pure hypercholesterolemia, unspecified: Secondary | ICD-10-CM | POA: Diagnosis not present

## 2021-11-10 DIAGNOSIS — E669 Obesity, unspecified: Secondary | ICD-10-CM | POA: Diagnosis not present

## 2021-11-11 ENCOUNTER — Other Ambulatory Visit (HOSPITAL_COMMUNITY): Payer: Self-pay

## 2021-11-11 MED ORDER — SAXENDA 18 MG/3ML ~~LOC~~ SOPN
PEN_INJECTOR | SUBCUTANEOUS | 1 refills | Status: AC
  Filled 2021-11-11: qty 15, 30d supply, fill #0

## 2021-12-04 DIAGNOSIS — E669 Obesity, unspecified: Secondary | ICD-10-CM | POA: Diagnosis not present

## 2021-12-04 DIAGNOSIS — I10 Essential (primary) hypertension: Secondary | ICD-10-CM | POA: Diagnosis not present

## 2021-12-04 DIAGNOSIS — R7303 Prediabetes: Secondary | ICD-10-CM | POA: Diagnosis not present

## 2021-12-04 DIAGNOSIS — E7800 Pure hypercholesterolemia, unspecified: Secondary | ICD-10-CM | POA: Diagnosis not present

## 2021-12-05 ENCOUNTER — Other Ambulatory Visit (HOSPITAL_COMMUNITY): Payer: Self-pay

## 2021-12-05 MED ORDER — SAXENDA 18 MG/3ML ~~LOC~~ SOPN
PEN_INJECTOR | SUBCUTANEOUS | 1 refills | Status: AC
  Filled 2021-12-05: qty 15, 30d supply, fill #0

## 2021-12-08 ENCOUNTER — Other Ambulatory Visit (HOSPITAL_COMMUNITY): Payer: Self-pay

## 2021-12-11 ENCOUNTER — Other Ambulatory Visit (HOSPITAL_COMMUNITY): Payer: Self-pay

## 2021-12-23 ENCOUNTER — Other Ambulatory Visit (HOSPITAL_COMMUNITY): Payer: Self-pay

## 2021-12-28 DIAGNOSIS — R7309 Other abnormal glucose: Secondary | ICD-10-CM | POA: Diagnosis not present

## 2021-12-31 ENCOUNTER — Other Ambulatory Visit (HOSPITAL_COMMUNITY): Payer: Self-pay

## 2021-12-31 DIAGNOSIS — E669 Obesity, unspecified: Secondary | ICD-10-CM | POA: Diagnosis not present

## 2021-12-31 DIAGNOSIS — I10 Essential (primary) hypertension: Secondary | ICD-10-CM | POA: Diagnosis not present

## 2021-12-31 DIAGNOSIS — E7800 Pure hypercholesterolemia, unspecified: Secondary | ICD-10-CM | POA: Diagnosis not present

## 2021-12-31 DIAGNOSIS — R7303 Prediabetes: Secondary | ICD-10-CM | POA: Diagnosis not present

## 2022-01-01 ENCOUNTER — Other Ambulatory Visit (HOSPITAL_COMMUNITY): Payer: Self-pay

## 2022-01-01 MED ORDER — TECHLITE PEN NEEDLES 32G X 4 MM MISC
0 refills | Status: AC
  Filled 2022-01-01: qty 100, 90d supply, fill #0

## 2022-01-01 MED ORDER — SAXENDA 18 MG/3ML ~~LOC~~ SOPN
PEN_INJECTOR | SUBCUTANEOUS | 1 refills | Status: AC
  Filled 2022-01-01: qty 15, 30d supply, fill #0

## 2022-01-05 ENCOUNTER — Other Ambulatory Visit (HOSPITAL_COMMUNITY): Payer: Self-pay

## 2022-01-07 ENCOUNTER — Other Ambulatory Visit (HOSPITAL_COMMUNITY): Payer: Self-pay

## 2022-01-28 DIAGNOSIS — R7309 Other abnormal glucose: Secondary | ICD-10-CM | POA: Diagnosis not present

## 2022-02-02 ENCOUNTER — Other Ambulatory Visit (HOSPITAL_COMMUNITY): Payer: Self-pay

## 2022-02-02 DIAGNOSIS — R7303 Prediabetes: Secondary | ICD-10-CM | POA: Diagnosis not present

## 2022-02-02 DIAGNOSIS — E7800 Pure hypercholesterolemia, unspecified: Secondary | ICD-10-CM | POA: Diagnosis not present

## 2022-02-02 DIAGNOSIS — I10 Essential (primary) hypertension: Secondary | ICD-10-CM | POA: Diagnosis not present

## 2022-02-02 DIAGNOSIS — K5903 Drug induced constipation: Secondary | ICD-10-CM | POA: Diagnosis not present

## 2022-02-02 MED ORDER — WEGOVY 1.7 MG/0.75ML ~~LOC~~ SOAJ
1.7000 mg | SUBCUTANEOUS | 1 refills | Status: AC
  Filled 2022-02-02 – 2022-02-18 (×2): qty 3, 28d supply, fill #0

## 2022-02-09 DIAGNOSIS — H401123 Primary open-angle glaucoma, left eye, severe stage: Secondary | ICD-10-CM | POA: Diagnosis not present

## 2022-02-09 DIAGNOSIS — H401112 Primary open-angle glaucoma, right eye, moderate stage: Secondary | ICD-10-CM | POA: Diagnosis not present

## 2022-02-12 ENCOUNTER — Other Ambulatory Visit (HOSPITAL_COMMUNITY): Payer: Self-pay

## 2022-02-17 ENCOUNTER — Other Ambulatory Visit (HOSPITAL_COMMUNITY): Payer: Self-pay

## 2022-02-18 ENCOUNTER — Other Ambulatory Visit (HOSPITAL_COMMUNITY): Payer: Self-pay

## 2022-02-27 DIAGNOSIS — R7309 Other abnormal glucose: Secondary | ICD-10-CM | POA: Diagnosis not present

## 2022-03-03 ENCOUNTER — Other Ambulatory Visit (HOSPITAL_COMMUNITY): Payer: Self-pay

## 2022-03-03 DIAGNOSIS — K5903 Drug induced constipation: Secondary | ICD-10-CM | POA: Diagnosis not present

## 2022-03-03 DIAGNOSIS — E7800 Pure hypercholesterolemia, unspecified: Secondary | ICD-10-CM | POA: Diagnosis not present

## 2022-03-03 DIAGNOSIS — I10 Essential (primary) hypertension: Secondary | ICD-10-CM | POA: Diagnosis not present

## 2022-03-03 DIAGNOSIS — R7303 Prediabetes: Secondary | ICD-10-CM | POA: Diagnosis not present

## 2022-03-03 MED ORDER — WEGOVY 2.4 MG/0.75ML ~~LOC~~ SOAJ
2.4000 mg | SUBCUTANEOUS | 1 refills | Status: AC
  Filled 2022-03-03: qty 3, 28d supply, fill #0
  Filled 2022-04-01 (×2): qty 3, 28d supply, fill #1

## 2022-03-04 ENCOUNTER — Other Ambulatory Visit (HOSPITAL_COMMUNITY): Payer: Self-pay

## 2022-03-09 DIAGNOSIS — H401112 Primary open-angle glaucoma, right eye, moderate stage: Secondary | ICD-10-CM | POA: Diagnosis not present

## 2022-03-09 DIAGNOSIS — H401123 Primary open-angle glaucoma, left eye, severe stage: Secondary | ICD-10-CM | POA: Diagnosis not present

## 2022-03-30 DIAGNOSIS — R7309 Other abnormal glucose: Secondary | ICD-10-CM | POA: Diagnosis not present

## 2022-04-01 ENCOUNTER — Other Ambulatory Visit (HOSPITAL_COMMUNITY): Payer: Self-pay

## 2022-04-08 ENCOUNTER — Other Ambulatory Visit (HOSPITAL_COMMUNITY): Payer: Self-pay

## 2022-04-08 DIAGNOSIS — I10 Essential (primary) hypertension: Secondary | ICD-10-CM | POA: Diagnosis not present

## 2022-04-08 DIAGNOSIS — R7303 Prediabetes: Secondary | ICD-10-CM | POA: Diagnosis not present

## 2022-04-08 DIAGNOSIS — E7800 Pure hypercholesterolemia, unspecified: Secondary | ICD-10-CM | POA: Diagnosis not present

## 2022-04-08 DIAGNOSIS — K5903 Drug induced constipation: Secondary | ICD-10-CM | POA: Diagnosis not present

## 2022-04-08 MED ORDER — NALTREXONE HCL 50 MG PO TABS
25.0000 mg | ORAL_TABLET | Freq: Every day | ORAL | 0 refills | Status: DC
  Filled 2022-04-08: qty 30, 60d supply, fill #0

## 2022-04-08 MED ORDER — BUPROPION HCL ER (XL) 150 MG PO TB24
150.0000 mg | ORAL_TABLET | Freq: Every morning | ORAL | 1 refills | Status: AC
  Filled 2022-04-08: qty 30, 30d supply, fill #0
  Filled 2022-08-28: qty 30, 30d supply, fill #1

## 2022-04-08 MED ORDER — OZEMPIC (2 MG/DOSE) 8 MG/3ML ~~LOC~~ SOPN
2.0000 mg | PEN_INJECTOR | SUBCUTANEOUS | 1 refills | Status: AC
  Filled 2022-04-08: qty 3, 28d supply, fill #0

## 2022-04-14 ENCOUNTER — Other Ambulatory Visit (HOSPITAL_COMMUNITY): Payer: Self-pay

## 2022-04-30 DIAGNOSIS — R7309 Other abnormal glucose: Secondary | ICD-10-CM | POA: Diagnosis not present

## 2022-05-07 ENCOUNTER — Other Ambulatory Visit (HOSPITAL_COMMUNITY): Payer: Self-pay

## 2022-05-07 DIAGNOSIS — I10 Essential (primary) hypertension: Secondary | ICD-10-CM | POA: Diagnosis not present

## 2022-05-07 DIAGNOSIS — E7800 Pure hypercholesterolemia, unspecified: Secondary | ICD-10-CM | POA: Diagnosis not present

## 2022-05-07 DIAGNOSIS — R7303 Prediabetes: Secondary | ICD-10-CM | POA: Diagnosis not present

## 2022-05-07 MED ORDER — BUPROPION HCL ER (XL) 150 MG PO TB24
150.0000 mg | ORAL_TABLET | Freq: Every morning | ORAL | 1 refills | Status: AC
  Filled 2022-05-07: qty 30, 30d supply, fill #0
  Filled 2022-10-06: qty 30, 30d supply, fill #1

## 2022-05-07 MED ORDER — NALTREXONE HCL 50 MG PO TABS
50.0000 mg | ORAL_TABLET | Freq: Every day | ORAL | 1 refills | Status: DC
  Filled 2022-05-07: qty 30, 30d supply, fill #0
  Filled 2022-08-28: qty 30, 30d supply, fill #1

## 2022-05-29 DIAGNOSIS — R7309 Other abnormal glucose: Secondary | ICD-10-CM | POA: Diagnosis not present

## 2022-06-01 DIAGNOSIS — I10 Essential (primary) hypertension: Secondary | ICD-10-CM | POA: Diagnosis not present

## 2022-06-01 DIAGNOSIS — E7800 Pure hypercholesterolemia, unspecified: Secondary | ICD-10-CM | POA: Diagnosis not present

## 2022-06-01 DIAGNOSIS — D229 Melanocytic nevi, unspecified: Secondary | ICD-10-CM | POA: Diagnosis not present

## 2022-06-01 DIAGNOSIS — Z131 Encounter for screening for diabetes mellitus: Secondary | ICD-10-CM | POA: Diagnosis not present

## 2022-06-01 DIAGNOSIS — Z125 Encounter for screening for malignant neoplasm of prostate: Secondary | ICD-10-CM | POA: Diagnosis not present

## 2022-06-01 DIAGNOSIS — R251 Tremor, unspecified: Secondary | ICD-10-CM | POA: Diagnosis not present

## 2022-06-01 DIAGNOSIS — Z0000 Encounter for general adult medical examination without abnormal findings: Secondary | ICD-10-CM | POA: Diagnosis not present

## 2022-06-15 ENCOUNTER — Other Ambulatory Visit (HOSPITAL_COMMUNITY): Payer: Self-pay

## 2022-06-15 DIAGNOSIS — R7303 Prediabetes: Secondary | ICD-10-CM | POA: Diagnosis not present

## 2022-06-15 DIAGNOSIS — E7800 Pure hypercholesterolemia, unspecified: Secondary | ICD-10-CM | POA: Diagnosis not present

## 2022-06-15 DIAGNOSIS — I10 Essential (primary) hypertension: Secondary | ICD-10-CM | POA: Diagnosis not present

## 2022-06-15 MED ORDER — NALTREXONE HCL 50 MG PO TABS
50.0000 mg | ORAL_TABLET | Freq: Every day | ORAL | 1 refills | Status: AC
  Filled 2022-06-15: qty 30, 30d supply, fill #0
  Filled 2022-07-27: qty 30, 30d supply, fill #1

## 2022-06-15 MED ORDER — VICTOZA 18 MG/3ML ~~LOC~~ SOPN
0.6000 mg | PEN_INJECTOR | Freq: Every day | SUBCUTANEOUS | 1 refills | Status: AC
  Filled 2022-06-15: qty 3, 30d supply, fill #0

## 2022-06-15 MED ORDER — BUPROPION HCL ER (XL) 150 MG PO TB24
150.0000 mg | ORAL_TABLET | Freq: Every morning | ORAL | 1 refills | Status: AC
  Filled 2022-06-15: qty 30, 30d supply, fill #0
  Filled 2022-07-27: qty 30, 30d supply, fill #1

## 2022-06-16 ENCOUNTER — Other Ambulatory Visit (HOSPITAL_COMMUNITY): Payer: Self-pay

## 2022-06-18 ENCOUNTER — Other Ambulatory Visit (HOSPITAL_COMMUNITY): Payer: Self-pay

## 2022-06-19 ENCOUNTER — Other Ambulatory Visit (HOSPITAL_COMMUNITY): Payer: Self-pay

## 2022-06-29 ENCOUNTER — Other Ambulatory Visit (HOSPITAL_COMMUNITY): Payer: Self-pay

## 2022-06-29 DIAGNOSIS — R7309 Other abnormal glucose: Secondary | ICD-10-CM | POA: Diagnosis not present

## 2022-07-06 DIAGNOSIS — H401123 Primary open-angle glaucoma, left eye, severe stage: Secondary | ICD-10-CM | POA: Diagnosis not present

## 2022-07-06 DIAGNOSIS — H401112 Primary open-angle glaucoma, right eye, moderate stage: Secondary | ICD-10-CM | POA: Diagnosis not present

## 2022-07-13 ENCOUNTER — Ambulatory Visit: Payer: BC Managed Care – PPO | Admitting: Dermatology

## 2022-07-13 VITALS — BP 137/88

## 2022-07-13 DIAGNOSIS — L821 Other seborrheic keratosis: Secondary | ICD-10-CM

## 2022-07-13 DIAGNOSIS — L738 Other specified follicular disorders: Secondary | ICD-10-CM

## 2022-07-13 DIAGNOSIS — Z1283 Encounter for screening for malignant neoplasm of skin: Secondary | ICD-10-CM

## 2022-07-13 DIAGNOSIS — L814 Other melanin hyperpigmentation: Secondary | ICD-10-CM

## 2022-07-13 DIAGNOSIS — D1801 Hemangioma of skin and subcutaneous tissue: Secondary | ICD-10-CM

## 2022-07-13 DIAGNOSIS — W908XXA Exposure to other nonionizing radiation, initial encounter: Secondary | ICD-10-CM

## 2022-07-13 DIAGNOSIS — X32XXXA Exposure to sunlight, initial encounter: Secondary | ICD-10-CM

## 2022-07-13 DIAGNOSIS — L578 Other skin changes due to chronic exposure to nonionizing radiation: Secondary | ICD-10-CM

## 2022-07-13 NOTE — Patient Instructions (Signed)
Due to recent changes in healthcare laws, you may see results of your pathology and/or laboratory studies on MyChart before the doctors have had a chance to review them. We understand that in some cases there may be results that are confusing or concerning to you. Please understand that not all results are received at the same time and often the doctors may need to interpret multiple results in order to provide you with the best plan of care or course of treatment. Therefore, we ask that you please give us 2 business days to thoroughly review all your results before contacting the office for clarification. Should we see a critical lab result, you will be contacted sooner.   If You Need Anything After Your Visit  If you have any questions or concerns for your doctor, please call our main line at [phone] If no one answers, please leave a voicemail as directed and we will return your call as soon as possible. Messages left after 4 pm will be answered the following business day.   You may also send us a message via MyChart. We typically respond to MyChart messages within 1-2 business days.  For prescription refills, please ask your pharmacy to contact our office. Our fax number is [phone].  If you have an urgent issue when the clinic is closed that cannot wait until the next business day, you can page your doctor at the number below.    Please note that while we do our best to be available for urgent issues outside of office hours, we are not available 24/7.   If you have an urgent issue and are unable to reach us, you may choose to seek medical care at your doctor's office, retail clinic, urgent care center, or emergency room.  If you have a medical emergency, please immediately call 911 or go to the emergency department. In the event of inclement weather, please call our main line at [phone] for an update on the status of any delays or closures.  Dermatology Medication Tips: Please  keep the boxes that topical medications come in in order to help keep track of the instructions about where and how to use these. Pharmacies typically print the medication instructions only on the boxes and not directly on the medication tubes.   If your medication is too expensive, please contact our office at [phone] or send us a message through MyChart.   We are unable to tell what your co-pay for medications will be in advance as this is different depending on your insurance coverage. However, we may be able to find a substitute medication at lower cost or fill out paperwork to get insurance to cover a needed medication.   If a prior authorization is required to get your medication covered by your insurance company, please allow us 1-2 business days to complete this process.  Drug prices often vary depending on where the prescription is filled and some pharmacies may offer cheaper prices.  The website www.goodrx.com contains coupons for medications through different pharmacies. The prices here do not account for what the cost may be with help from insurance (it may be cheaper with your insurance), but the website can give you the price if you did not use any insurance.  - You can print the associated coupon and take it with your prescription to the pharmacy.  - You may also stop by our office during regular business hours and pick up a GoodRx coupon card.  - If you need your   prescription sent electronically to a different pharmacy, notify our office through [facility] MyChart or by phone at [phone]     

## 2022-07-13 NOTE — Progress Notes (Unsigned)
   New Patient Visit   Subjective  Jerry Hood is a 57 y.o. male who presents for the following: Skin Cancer Screening and Full Body Skin Exam  The patient presents for Upper Body Skin Exam (UBSE) for skin cancer screening and mole check. The patient has spots, moles and lesions to be evaluated, some may be new or changing and the patient has concerns that these could be cancer.    The following portions of the chart were reviewed this encounter and updated as appropriate: medications, allergies, medical history  Review of Systems:  No other skin or systemic complaints except as noted in HPI or Assessment and Plan.  Objective  Well appearing patient in no apparent distress; mood and affect are within normal limits.  All skin waist up examined. Relevant physical exam findings are noted in the Assessment and Plan.  A dermatoscope was used during the exam.  The following people were also present during my examination: , my medical assistant (male)   Assessment & Plan   Sebaceous Hyperplasia - Small yellow papules with a central dell - Benign-appearing - Observe. Call for changes.   Lentigines, Seborrheic Keratoses, Hemangiomas - Benign normal skin lesions - Benign-appearing - Call for any changes  Melanocytic Nevi - Tan-brown and/or pink-flesh-colored symmetric macules and papules - Benign appearing on exam today - Observation - Call clinic for new or changing moles - Recommend daily use of broad spectrum spf 30+ sunscreen to sun-exposed areas.   Actinic Damage - Chronic condition, secondary to cumulative UV/sun exposure - diffuse scaly erythematous macules with underlying dyspigmentation - Recommend daily broad spectrum sunscreen SPF 30+ to sun-exposed areas, reapply every 2 hours as needed.  - Staying in the shade or wearing long sleeves, sun glasses (UVA+UVB protection) and wide brim hats (4-inch brim around the entire circumference of the hat) are also recommended  for sun protection.  - Call for new or changing lesions.  Skin cancer screening performed today.  Return in about 1 year (around 07/13/2023).  I, Joanie Coddington, CMA, am acting as scribe for Langston Reusing, MD .   Documentation: I have reviewed the above documentation for accuracy and completeness, and I agree with the above.  Langston Reusing, MD

## 2022-07-14 ENCOUNTER — Encounter: Payer: Self-pay | Admitting: Dermatology

## 2022-07-27 ENCOUNTER — Other Ambulatory Visit (HOSPITAL_COMMUNITY): Payer: Self-pay

## 2022-07-28 ENCOUNTER — Other Ambulatory Visit (HOSPITAL_COMMUNITY): Payer: Self-pay

## 2022-07-30 DIAGNOSIS — M62830 Muscle spasm of back: Secondary | ICD-10-CM | POA: Diagnosis not present

## 2022-08-18 DIAGNOSIS — I10 Essential (primary) hypertension: Secondary | ICD-10-CM | POA: Diagnosis not present

## 2022-08-18 DIAGNOSIS — Z9189 Other specified personal risk factors, not elsewhere classified: Secondary | ICD-10-CM | POA: Diagnosis not present

## 2022-08-18 DIAGNOSIS — E7800 Pure hypercholesterolemia, unspecified: Secondary | ICD-10-CM | POA: Diagnosis not present

## 2022-08-18 DIAGNOSIS — E669 Obesity, unspecified: Secondary | ICD-10-CM | POA: Diagnosis not present

## 2022-08-18 DIAGNOSIS — R7303 Prediabetes: Secondary | ICD-10-CM | POA: Diagnosis not present

## 2022-08-28 ENCOUNTER — Other Ambulatory Visit (HOSPITAL_COMMUNITY): Payer: Self-pay

## 2022-09-16 ENCOUNTER — Other Ambulatory Visit (HOSPITAL_COMMUNITY): Payer: Self-pay

## 2022-09-16 DIAGNOSIS — R7303 Prediabetes: Secondary | ICD-10-CM | POA: Diagnosis not present

## 2022-09-16 DIAGNOSIS — I10 Essential (primary) hypertension: Secondary | ICD-10-CM | POA: Diagnosis not present

## 2022-09-16 DIAGNOSIS — E7800 Pure hypercholesterolemia, unspecified: Secondary | ICD-10-CM | POA: Diagnosis not present

## 2022-09-16 DIAGNOSIS — E669 Obesity, unspecified: Secondary | ICD-10-CM | POA: Diagnosis not present

## 2022-09-16 MED ORDER — RYBELSUS 7 MG PO TABS
7.0000 mg | ORAL_TABLET | Freq: Every day | ORAL | 1 refills | Status: AC
  Filled 2022-09-16: qty 30, 30d supply, fill #0

## 2022-09-16 MED ORDER — BUPROPION HCL ER (XL) 150 MG PO TB24
150.0000 mg | ORAL_TABLET | Freq: Every day | ORAL | 1 refills | Status: AC
  Filled 2022-12-03 – 2022-12-21 (×2): qty 30, 30d supply, fill #0

## 2022-09-16 MED ORDER — NALTREXONE HCL 50 MG PO TABS
50.0000 mg | ORAL_TABLET | Freq: Every day | ORAL | 1 refills | Status: AC
  Filled 2022-10-06: qty 30, 30d supply, fill #0
  Filled 2022-12-03 – 2022-12-21 (×2): qty 30, 30d supply, fill #1

## 2022-09-17 ENCOUNTER — Other Ambulatory Visit (HOSPITAL_COMMUNITY): Payer: Self-pay

## 2022-10-06 ENCOUNTER — Other Ambulatory Visit (HOSPITAL_COMMUNITY): Payer: Self-pay

## 2022-10-08 ENCOUNTER — Other Ambulatory Visit (HOSPITAL_COMMUNITY): Payer: Self-pay

## 2022-10-15 ENCOUNTER — Other Ambulatory Visit (HOSPITAL_COMMUNITY): Payer: Self-pay

## 2022-10-15 DIAGNOSIS — E7800 Pure hypercholesterolemia, unspecified: Secondary | ICD-10-CM | POA: Diagnosis not present

## 2022-10-15 DIAGNOSIS — R7303 Prediabetes: Secondary | ICD-10-CM | POA: Diagnosis not present

## 2022-10-15 DIAGNOSIS — E669 Obesity, unspecified: Secondary | ICD-10-CM | POA: Diagnosis not present

## 2022-10-15 DIAGNOSIS — I10 Essential (primary) hypertension: Secondary | ICD-10-CM | POA: Diagnosis not present

## 2022-10-15 MED ORDER — NALTREXONE HCL 50 MG PO TABS
50.0000 mg | ORAL_TABLET | Freq: Every day | ORAL | 1 refills | Status: AC
  Filled 2022-10-15 – 2023-02-08 (×2): qty 30, 30d supply, fill #0

## 2022-10-15 MED ORDER — BUPROPION HCL ER (XL) 150 MG PO TB24
150.0000 mg | ORAL_TABLET | Freq: Every morning | ORAL | 1 refills | Status: AC
  Filled 2022-10-15 – 2023-02-08 (×2): qty 30, 30d supply, fill #0

## 2022-10-19 ENCOUNTER — Other Ambulatory Visit (HOSPITAL_COMMUNITY): Payer: Self-pay

## 2022-11-04 ENCOUNTER — Other Ambulatory Visit (HOSPITAL_COMMUNITY): Payer: Self-pay

## 2022-11-12 ENCOUNTER — Other Ambulatory Visit (HOSPITAL_COMMUNITY): Payer: Self-pay

## 2022-11-23 DIAGNOSIS — H401112 Primary open-angle glaucoma, right eye, moderate stage: Secondary | ICD-10-CM | POA: Diagnosis not present

## 2022-11-23 DIAGNOSIS — H401123 Primary open-angle glaucoma, left eye, severe stage: Secondary | ICD-10-CM | POA: Diagnosis not present

## 2022-12-03 ENCOUNTER — Other Ambulatory Visit (HOSPITAL_COMMUNITY): Payer: Self-pay

## 2022-12-15 ENCOUNTER — Other Ambulatory Visit (HOSPITAL_COMMUNITY): Payer: Self-pay

## 2022-12-21 ENCOUNTER — Other Ambulatory Visit (HOSPITAL_COMMUNITY): Payer: Self-pay

## 2023-02-08 ENCOUNTER — Other Ambulatory Visit (HOSPITAL_COMMUNITY): Payer: Self-pay

## 2023-04-14 DIAGNOSIS — H401123 Primary open-angle glaucoma, left eye, severe stage: Secondary | ICD-10-CM | POA: Diagnosis not present

## 2023-04-14 DIAGNOSIS — H401112 Primary open-angle glaucoma, right eye, moderate stage: Secondary | ICD-10-CM | POA: Diagnosis not present

## 2023-06-16 ENCOUNTER — Other Ambulatory Visit (HOSPITAL_BASED_OUTPATIENT_CLINIC_OR_DEPARTMENT_OTHER): Payer: Self-pay | Admitting: Family Medicine

## 2023-06-16 DIAGNOSIS — Z131 Encounter for screening for diabetes mellitus: Secondary | ICD-10-CM | POA: Diagnosis not present

## 2023-06-16 DIAGNOSIS — E7800 Pure hypercholesterolemia, unspecified: Secondary | ICD-10-CM | POA: Diagnosis not present

## 2023-06-16 DIAGNOSIS — I10 Essential (primary) hypertension: Secondary | ICD-10-CM | POA: Diagnosis not present

## 2023-06-16 DIAGNOSIS — Z125 Encounter for screening for malignant neoplasm of prostate: Secondary | ICD-10-CM | POA: Diagnosis not present

## 2023-06-16 DIAGNOSIS — Z0000 Encounter for general adult medical examination without abnormal findings: Secondary | ICD-10-CM | POA: Diagnosis not present

## 2023-06-16 DIAGNOSIS — Z13220 Encounter for screening for lipoid disorders: Secondary | ICD-10-CM | POA: Diagnosis not present

## 2023-06-29 ENCOUNTER — Ambulatory Visit (HOSPITAL_COMMUNITY)
Admission: RE | Admit: 2023-06-29 | Discharge: 2023-06-29 | Disposition: A | Discharge status: Home / Self Care | Payer: Self-pay | Source: Ambulatory Visit | Attending: Family Medicine | Admitting: Family Medicine

## 2023-06-29 DIAGNOSIS — E7800 Pure hypercholesterolemia, unspecified: Secondary | ICD-10-CM | POA: Insufficient documentation

## 2023-08-16 DIAGNOSIS — H401123 Primary open-angle glaucoma, left eye, severe stage: Secondary | ICD-10-CM | POA: Diagnosis not present

## 2023-08-16 DIAGNOSIS — H401112 Primary open-angle glaucoma, right eye, moderate stage: Secondary | ICD-10-CM | POA: Diagnosis not present

## 2023-12-20 DIAGNOSIS — H401112 Primary open-angle glaucoma, right eye, moderate stage: Secondary | ICD-10-CM | POA: Diagnosis not present

## 2023-12-20 DIAGNOSIS — H401123 Primary open-angle glaucoma, left eye, severe stage: Secondary | ICD-10-CM | POA: Diagnosis not present
